# Patient Record
Sex: Female | Born: 2000 | Race: Black or African American | Hispanic: No | Marital: Single | Smoking: Never smoker
Health system: Southern US, Community
[De-identification: ages and names within clinical notes are randomized; demographics above are authoritative.]

## PROBLEM LIST (undated history)

## (undated) DIAGNOSIS — M419 Scoliosis, unspecified: Secondary | ICD-10-CM

---
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
## 2021-01-19 ENCOUNTER — Ambulatory Visit (HOSPITAL_COMMUNITY)
Admission: EM | Admit: 2021-01-19 | Discharge: 2021-01-19 | Disposition: A | Discharge status: Home / Self Care | Payer: Medicaid Other | Attending: Emergency Medicine | Admitting: Emergency Medicine

## 2021-01-19 ENCOUNTER — Other Ambulatory Visit: Payer: Self-pay

## 2021-01-19 ENCOUNTER — Ambulatory Visit (INDEPENDENT_AMBULATORY_CARE_PROVIDER_SITE_OTHER): Payer: Medicaid Other

## 2021-01-19 DIAGNOSIS — M79632 Pain in left forearm: Secondary | ICD-10-CM

## 2021-01-19 DIAGNOSIS — M25532 Pain in left wrist: Secondary | ICD-10-CM

## 2021-01-19 MED ORDER — IBUPROFEN 800 MG PO TABS: 800.0000 mg | ORAL_TABLET | Freq: Three times a day (TID) | ORAL | 0 refills | Status: DC

## 2021-01-19 MED ORDER — CYCLOBENZAPRINE HCL 10 MG PO TABS: 10.0000 mg | ORAL_TABLET | Freq: Every day | ORAL | 0 refills | Status: DC
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
## 2021-01-19 NOTE — Discharge Instructions (Addendum)
Take ibuprofen three times a day for the next 3-5 days then as needed to help with swelling and pain  Can use muscle relaxer at bedtime, this mediation may make you drowsy, if this occurs you may take half a pill to decrease this effect   Can use ice in 15 minutes interval for the next 24 hours then can switch to heat in 15 minutes intervals as needed for comfort  As pain decreases, attempt to move around fingers, hand, wrist and arm as tolerated
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
## 2021-03-29 IMAGING — CR DG THORACIC SPINE 2V
2 series · 2 of 2 positions shown · non-contrast
Comparison: None.

CLINICAL DATA: Chronic back pain

EXAM:
THORACIC SPINE 2 VIEWS

[w thoracic spine ap]
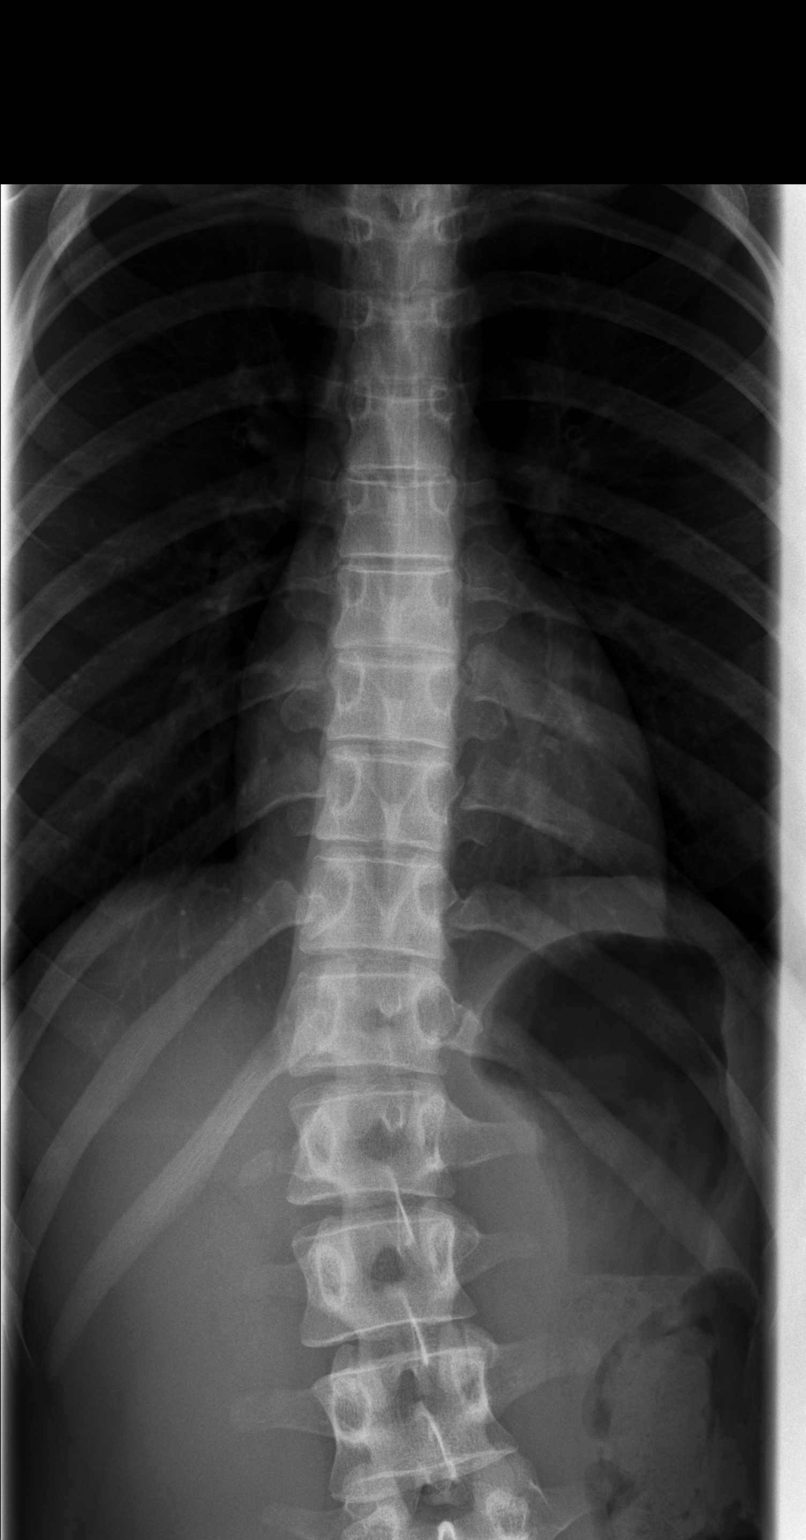

[w thoracic spine lat]
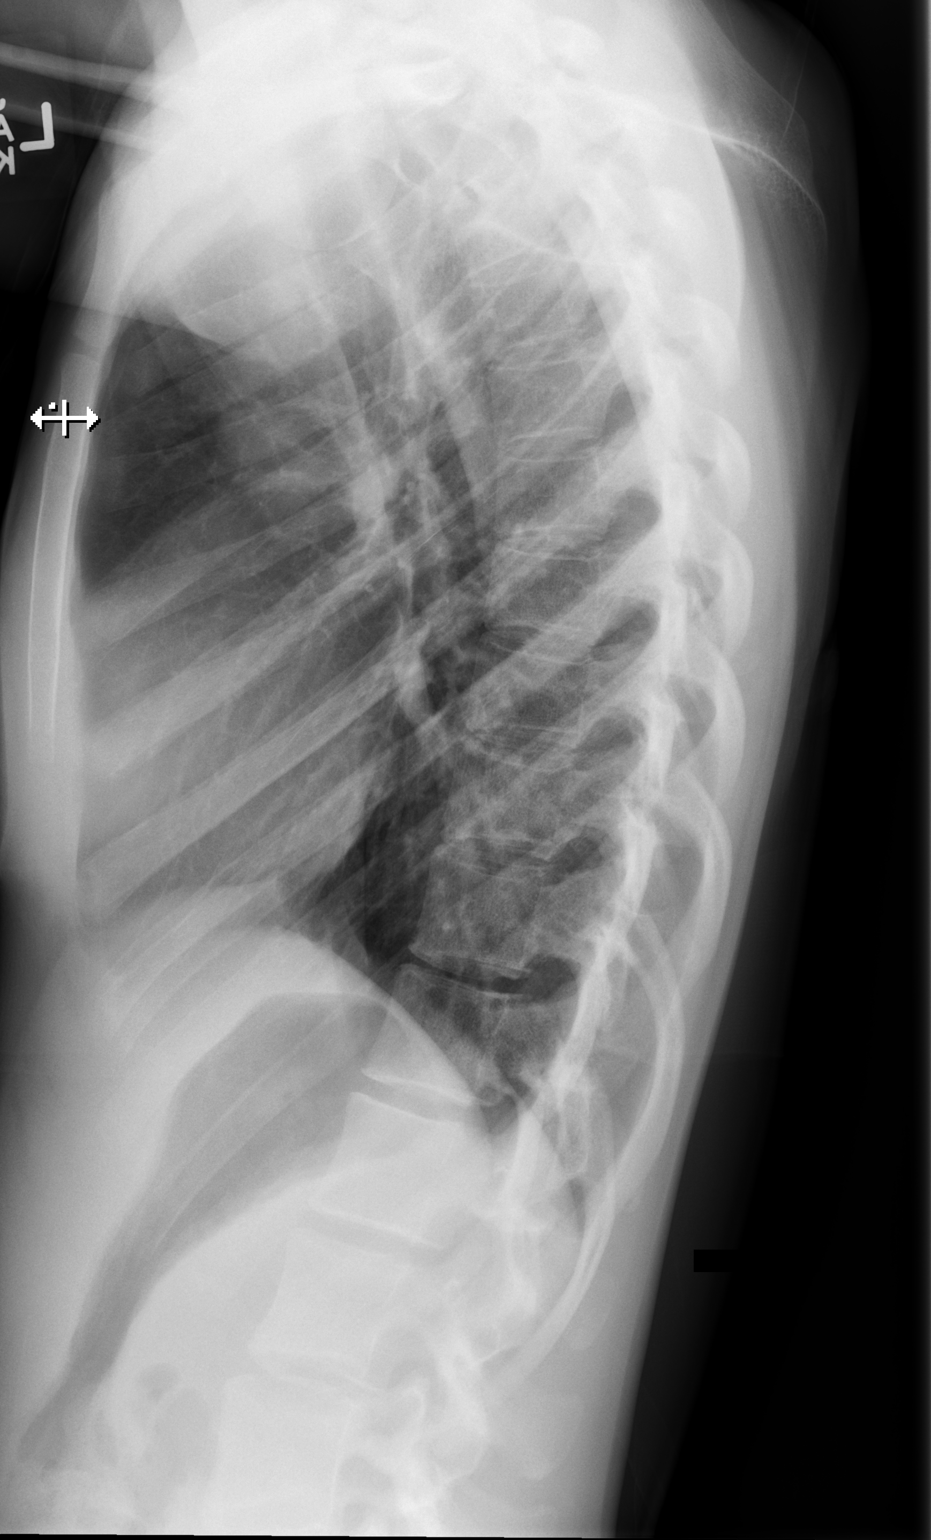

[2 of 2 positions shown; findings below may reference images not displayed]

FINDINGS: Mild dextroscoliosis of the thoracolumbar spine. Sagittal alignment
is normal. Vertebral body heights and disc spaces are normal
IMPRESSION: Mild thoracolumbar scoliosis

## 2021-04-11 ENCOUNTER — Encounter (HOSPITAL_COMMUNITY): Payer: Self-pay

## 2021-04-11 ENCOUNTER — Ambulatory Visit (HOSPITAL_COMMUNITY)
Admission: RE | Admit: 2021-04-11 | Discharge: 2021-04-11 | Disposition: A | Discharge status: Home / Self Care | Payer: Medicaid Other | Source: Ambulatory Visit | Attending: Family Medicine | Admitting: Family Medicine

## 2021-04-11 ENCOUNTER — Other Ambulatory Visit: Payer: Self-pay

## 2021-04-11 VITALS — BP 110/72 | HR 85 | Temp 98.4°F | Resp 17

## 2021-04-11 DIAGNOSIS — R110 Nausea: Secondary | ICD-10-CM

## 2021-04-11 DIAGNOSIS — Z3201 Encounter for pregnancy test, result positive: Secondary | ICD-10-CM | POA: Diagnosis not present

## 2021-04-11 LAB — POC URINE PREG, ED: Preg Test, Ur: POSITIVE — AB

## 2021-04-11 NOTE — ED Triage Notes (Signed)
Pt is present today with a possible pregnancy.

## 2021-04-11 NOTE — ED Provider Notes (Signed)
[facility]    CSN: [id number] Arrival date & time: 04/11/21  1552      History   Chief Complaint Chief Complaint  Patient presents with   Possible Pregnancy    HPI Stephanie Nichols is a 20 y.o. female.   Patient presenting today after getting a positive home pregnancy test several days ago to have pregnancy confirmation.  She states the clinic she is trying to go to requires a documented positive pregnancy test prior to excepting her as a patient.  She does not recall when her LMP was, states she has historically irregular periods.  The reason she took a pregnancy test 2 days ago was because of some unexplained nausea.  She is otherwise asymptomatic, no significant fatigue, abnormal vaginal discharge, spotting or bleeding, pelvic pain or cramping.   Past Medical History:  Diagnosis Date   Scoliosis     There are no problems to display for this patient.   History reviewed. No pertinent surgical history.  OB History   No obstetric history on file.      Home Medications    Prior to Admission medications   Medication Sig Start Date End Date Taking? Authorizing Provider  acetaminophen (TYLENOL) 325 MG tablet Take 2 tablets (650 mg total) by mouth every 6 (six) hours as needed. 03/24/20   Darr, Gerilyn Pilgrim, PA-C  cyclobenzaprine (FLEXERIL) 10 MG tablet Take 1 tablet (10 mg total) by mouth at bedtime. 01/19/21   White, Elita Boone, NP  ibuprofen (ADVIL) 800 MG tablet Take 1 tablet (800 mg total) by mouth 3 (three) times daily. 01/19/21   Valinda Hoar, NP    Family History Family History  Problem Relation Age of Onset   Hypertension Mother     Social History Social History   Tobacco Use   Smoking status: Passive Smoke Exposure - Never Smoker   Smokeless tobacco: Never  Substance Use Topics   Alcohol use: No   Drug use: No     Allergies   Penicillins   Review of Systems Review of Systems Per HPI  Physical Exam Triage Vital Signs ED Triage Vitals  Enc  Vitals Group     BP 04/11/21 1623 110/72     Pulse Rate 04/11/21 1623 85     Resp 04/11/21 1623 17     Temp 04/11/21 1623 98.4 F (36.9 C)     Temp src --      SpO2 04/11/21 1623 97 %     Weight --      Height --      Head Circumference --      Peak Flow --      Pain Score 04/11/21 1622 0     Pain Loc --      Pain Edu? --      Excl. in GC? --    No data found.  Updated Vital Signs BP 110/72   Pulse 85   Temp 98.4 F (36.9 C)   Resp 17   SpO2 97%   Visual Acuity Right Eye Distance:   Left Eye Distance:   Bilateral Distance:    Right Eye Near:   Left Eye Near:    Bilateral Near:     Physical Exam Vitals and nursing note reviewed.  Constitutional:      Appearance: Normal appearance. She is not ill-appearing.  HENT:     Head: Atraumatic.     Mouth/Throat:     Mouth: Mucous membranes are moist.  Pharynx: Oropharynx is clear.  Eyes:     Extraocular Movements: Extraocular movements intact.     Conjunctiva/sclera: Conjunctivae normal.  Cardiovascular:     Rate and Rhythm: Normal rate and regular rhythm.     Heart sounds: Normal heart sounds.  Pulmonary:     Effort: Pulmonary effort is normal.     Breath sounds: Normal breath sounds.  Abdominal:     General: Bowel sounds are normal. There is no distension.     Palpations: Abdomen is soft.     Tenderness: There is no abdominal tenderness. There is no guarding.  Musculoskeletal:        General: Normal range of motion.     Cervical back: Normal range of motion and neck supple.  Skin:    General: Skin is warm and dry.  Neurological:     Mental Status: She is alert and oriented to person, place, and time.  Psychiatric:        Mood and Affect: Mood normal.        Thought Content: Thought content normal.        Judgment: Judgment normal.   UC Treatments / Results  Labs (all labs ordered are listed, but only abnormal results are displayed) Labs Reviewed  POC URINE PREG, ED - Abnormal; Notable for the  following components:      Result Value   Preg Test, Ur POSITIVE (*)    All other components within normal limits    EKG   Radiology No results found.  Procedures Procedures (including critical care time)  Medications Ordered in UC Medications - No data to display  Initial Impression / Assessment and Plan / UC Course  I have reviewed the triage vital signs and the nursing notes.  Pertinent labs & imaging results that were available during my care of the patient were reviewed by me and considered in my medical decision making (see chart for details).     Pregnancy test positive.  Exam and vitals reassuring.  Discussed good prenatal diet, prenatal vitamins and printed out positive result per her request.  Follow-up with OB/GYN for further management.  Final Clinical Impressions(s) / UC Diagnoses   Final diagnoses:  Positive pregnancy test  Nausea   Discharge Instructions   None    ED Prescriptions   None    PDMP not reviewed this encounter.   Particia Nearing, [place] 04/11/21 1656

## 2021-04-20 ENCOUNTER — Ambulatory Visit: Payer: Medicaid Other

## 2022-12-08 ENCOUNTER — Emergency Department (HOSPITAL_BASED_OUTPATIENT_CLINIC_OR_DEPARTMENT_OTHER)
Admission: EM | Admit: 2022-12-08 | Discharge: 2022-12-08 | Disposition: A | Discharge status: Home / Self Care | Payer: Medicaid Other | Attending: Emergency Medicine | Admitting: Emergency Medicine

## 2022-12-08 ENCOUNTER — Encounter (HOSPITAL_BASED_OUTPATIENT_CLINIC_OR_DEPARTMENT_OTHER): Payer: Self-pay

## 2022-12-08 ENCOUNTER — Other Ambulatory Visit: Payer: Self-pay

## 2022-12-08 DIAGNOSIS — R059 Cough, unspecified: Secondary | ICD-10-CM | POA: Diagnosis present

## 2022-12-08 DIAGNOSIS — J069 Acute upper respiratory infection, unspecified: Secondary | ICD-10-CM | POA: Diagnosis not present

## 2022-12-08 DIAGNOSIS — Z1152 Encounter for screening for COVID-19: Secondary | ICD-10-CM | POA: Insufficient documentation

## 2022-12-08 LAB — RESP PANEL BY RT-PCR (RSV, FLU A&B, COVID)  RVPGX2
Influenza A by PCR: NEGATIVE
Influenza B by PCR: NEGATIVE
Resp Syncytial Virus by PCR: NEGATIVE
SARS Coronavirus 2 by RT PCR: NEGATIVE

## 2022-12-08 MED ORDER — IBUPROFEN 400 MG PO TABS
600.0000 mg | ORAL_TABLET | Freq: Once | ORAL | Status: AC
  Administered 2022-12-08: 600 mg via ORAL
  Filled 2022-12-08: qty 1

## 2022-12-08 MED ORDER — ONDANSETRON 4 MG PO TBDP
8.0000 mg | ORAL_TABLET | Freq: Once | ORAL | Status: AC
  Administered 2022-12-08: 8 mg via ORAL
  Filled 2022-12-08: qty 2

## 2022-12-08 MED ORDER — ONDANSETRON HCL 4 MG PO TABS: 4.0000 mg | ORAL_TABLET | Freq: Four times a day (QID) | ORAL | 0 refills | Status: DC

## 2022-12-08 MED ORDER — IBUPROFEN 600 MG PO TABS: 600.0000 mg | ORAL_TABLET | Freq: Four times a day (QID) | ORAL | 0 refills | Status: DC | PRN

## 2022-12-08 NOTE — ED Notes (Signed)
Pt reports fever, chills and nausea x 3 days

## 2022-12-08 NOTE — ED Provider Notes (Signed)
[facility] Provider Note  CSN: [id number] Arrival date & time: 12/08/22 1308  Chief Complaint(s) Chills  HPI Stephanie Nichols is a 22 y.o. female without significant past medical history presenting to the emergency department with bodyaches.  Patient reports body aches for 3 days with associated rhinorrhea, cough, subjective fevers and chills.  She also reports nausea and vomiting.  No diarrhea.  No abdominal pain.  She is taking NyQuil for symptoms.  She also reports mild headache.   Past Medical History Past Medical History:  Diagnosis Date   Scoliosis    There are no problems to display for this patient.  Home Medication(s) Prior to Admission medications   Medication Sig Start Date End Date Taking? Authorizing Provider  ibuprofen (ADVIL) 600 MG tablet Take 1 tablet (600 mg total) by mouth every 6 (six) hours as needed. 12/08/22  Yes Cristie Hem, MD  ondansetron (ZOFRAN) 4 MG tablet Take 1 tablet (4 mg total) by mouth every 6 (six) hours. 12/08/22  Yes Cristie Hem, MD  acetaminophen (TYLENOL) 325 MG tablet Take 2 tablets (650 mg total) by mouth every 6 (six) hours as needed. 03/24/20   Darr, Edison Nasuti, PA-C  cyclobenzaprine (FLEXERIL) 10 MG tablet Take 1 tablet (10 mg total) by mouth at bedtime. 01/19/21   Hans Eden, NP                                                                                                                                    Past Surgical History History reviewed. No pertinent surgical history. Family History Family History  Problem Relation Age of Onset   Hypertension Mother     Social History Social History   Tobacco Use   Smoking status: Never    Passive exposure: Yes   Smokeless tobacco: Never  Substance Use Topics   Alcohol use: Yes    Comment: Few Times/Weekly   Drug use: No   Allergies Penicillins  Review of Systems Review of Systems  All other systems reviewed and are  negative.   Physical Exam Vital Signs  I have reviewed the triage vital signs BP (!) 140/89 (BP Location: Right Arm)   Pulse 87   Temp 98.4 F (36.9 C)   Resp 16   Ht '5\' 5"'$  (1.651 m)   Wt 59 kg   SpO2 99%   BMI 21.63 kg/m  Physical Exam Vitals and nursing note reviewed.  Constitutional:      General: She is not in acute distress.    Appearance: She is well-developed.  HENT:     Head: Normocephalic and atraumatic.     Mouth/Throat:     Mouth: Mucous membranes are moist.  Eyes:     Pupils: Pupils are equal, round, and reactive to light.  Cardiovascular:     Rate and Rhythm: Normal rate and regular rhythm.     Heart sounds: No murmur  heard. Pulmonary:     Effort: Pulmonary effort is normal. No respiratory distress.     Breath sounds: Normal breath sounds.  Abdominal:     General: Abdomen is flat.     Palpations: Abdomen is soft.     Tenderness: There is no abdominal tenderness.  Musculoskeletal:        General: No tenderness.     Right lower leg: No edema.     Left lower leg: No edema.  Skin:    General: Skin is warm and dry.  Neurological:     General: No focal deficit present.     Mental Status: She is alert. Mental status is at baseline.  Psychiatric:        Mood and Affect: Mood normal.        Behavior: Behavior normal.     ED Results and Treatments Labs (all labs ordered are listed, but only abnormal results are displayed) Labs Reviewed  RESP PANEL BY RT-PCR (RSV, FLU A&B, COVID)  RVPGX2                                                                                                                          Radiology No results found.  Pertinent labs & imaging results that were available during my care of the patient were reviewed by me and considered in my medical decision making (see MDM for details).  Medications Ordered in ED Medications  ibuprofen (ADVIL) tablet 600 mg (600 mg Oral Given 12/08/22 1424)  ondansetron (ZOFRAN-ODT) disintegrating  tablet 8 mg (8 mg Oral Given 12/08/22 1425)                                                                                                                                     Procedures Procedures  (including critical care time)  Medical Decision Making / ED Course   MDM:  22 year old female presents to the emergency department for URI type symptoms.  Patient very well-appearing, vital signs reassuring without significant abnormality.  Lungs clear on exam.  Suspect viral URI, COVID/flu/RSV swab sent.  Will give Motrin and Zofran.  Will send prescription for Zofran given nausea.  Doubt pneumonia.  Although patient has nausea and vomiting no abdominal pain or tenderness to suggest intra-abdominal process such as pancreatitis, cholecystitis or appendicitis.  Encourage p.o. fluids.  Clinical Course as of 12/08/22 1531  Wed Dec 08, 2022  1531 Flu/Covid/RSV negative. Will discharge patient to home. All questions answered. Patient comfortable with plan of discharge. Return precautions discussed with patient and specified on the after visit summary. [WS]    Clinical Course User Index [WS] Cristie Hem, MD     Additional history obtained: -Additional history obtained from spouse    Lab Tests: -I ordered, reviewed, and interpreted labs.   The pertinent results include:   Labs Reviewed  RESP PANEL BY RT-PCR (RSV, FLU A&B, COVID)  RVPGX2    Notable for negative flu/covid/RSV  Medicines ordered and prescription drug management: Meds ordered this encounter  Medications   ibuprofen (ADVIL) tablet 600 mg   ondansetron (ZOFRAN-ODT) disintegrating tablet 8 mg   ondansetron (ZOFRAN) 4 MG tablet    Sig: Take 1 tablet (4 mg total) by mouth every 6 (six) hours.    Dispense:  12 tablet    Refill:  0   ibuprofen (ADVIL) 600 MG tablet    Sig: Take 1 tablet (600 mg total) by mouth every 6 (six) hours as needed.    Dispense:  30 tablet    Refill:  0    -I have reviewed the patients  home medicines and have made adjustments as needed   Reevaluation: After the interventions noted above, I reevaluated the patient and found that they have improved  Co morbidities that complicate the patient evaluation  Past Medical History:  Diagnosis Date   Scoliosis       Dispostion: Disposition decision including need for hospitalization was considered, and patient discharged from emergency department.    Final Clinical Impression(s) / ED Diagnoses Final diagnoses:  Viral URI     This chart was dictated using voice recognition software.  Despite best efforts to proofread,  errors can occur which can change the documentation meaning.    Cristie Hem, MD 12/08/22 [phone]

## 2022-12-08 NOTE — ED Triage Notes (Signed)
Patient here POV from Home.  Endorses Productive Cough, Chills, Subjective Fever, nausea for 3 Days.   No Sore Throat or Diarrhea.   NAD Noted during Triage. A&Ox4. Gcs 15. Ambulatory.

## 2022-12-22 ENCOUNTER — Other Ambulatory Visit: Payer: Self-pay

## 2022-12-22 ENCOUNTER — Emergency Department (HOSPITAL_BASED_OUTPATIENT_CLINIC_OR_DEPARTMENT_OTHER)
Admission: EM | Admit: 2022-12-22 | Discharge: 2022-12-22 | Disposition: A | Discharge status: Home / Self Care | Payer: Medicaid Other | Attending: Emergency Medicine | Admitting: Emergency Medicine

## 2022-12-22 DIAGNOSIS — R42 Dizziness and giddiness: Secondary | ICD-10-CM | POA: Insufficient documentation

## 2022-12-22 DIAGNOSIS — M419 Scoliosis, unspecified: Secondary | ICD-10-CM | POA: Diagnosis not present

## 2022-12-22 DIAGNOSIS — M545 Low back pain, unspecified: Secondary | ICD-10-CM

## 2022-12-22 MED ORDER — METHOCARBAMOL 500 MG PO TABS: 500.0000 mg | ORAL_TABLET | Freq: Two times a day (BID) | ORAL | 0 refills | Status: AC

## 2022-12-22 MED ORDER — IBUPROFEN 800 MG PO TABS: 800.0000 mg | ORAL_TABLET | Freq: Four times a day (QID) | ORAL | 0 refills | Status: AC | PRN

## 2022-12-22 MED ORDER — ACETAMINOPHEN 500 MG PO TABS: 1000.0000 mg | ORAL_TABLET | Freq: Four times a day (QID) | ORAL | 0 refills | Status: AC | PRN

## 2022-12-22 MED ORDER — ONDANSETRON HCL 4 MG PO TABS: 4.0000 mg | ORAL_TABLET | Freq: Three times a day (TID) | ORAL | 0 refills | Status: AC | PRN

## 2022-12-22 NOTE — ED Notes (Signed)
Pt verbalized understanding of d/c instructions, meds, and followup care. Denies questions. VSS, no distress noted. Steady gait to exit with all belongings.  ?

## 2022-12-22 NOTE — ED Provider Notes (Signed)
[facility] Provider Note   CSN: [id number] Arrival date & time: 12/22/22  1004     History  Chief Complaint  Patient presents with   Back Pain    Stephanie Nichols is a 22 y.o. female presents to the ED from home complaining of back pain for the past few days.  Patient states that she was diagnosed with scoliosis 2 to 3 years ago and was told that she would not require surgery.  Patient has been given ibuprofen and Robaxin in the past whenever she has flareups of lower back pain.  She reports that the muscle relaxer helps the most.  Patient states her lower back pain is worse whenever she is on her monthly cycle, which she is currently on.  Patient has been taking ibuprofen with minimal relief in her symptoms.  Patient also has concerns of occasional dizzy spells and lightheadedness that will occur at random and have associated nausea.  Patient states the spells will often happen when she is walking around or changing positions such as from sitting to standing.  Denies fever, shortness of breath, chest pain, pelvic pain, headache, seizure, syncope, weakness, vomiting.  She also denies any heavy lifting, falls, or other injury to her spine.      Home Medications Prior to Admission medications   Medication Sig Start Date End Date Taking? Authorizing Provider  acetaminophen (TYLENOL) 500 MG tablet Take 2 tablets (1,000 mg total) by mouth every 6 (six) hours as needed. 12/22/22  Yes Brooklinn Longbottom R, PA  ibuprofen (ADVIL) 800 MG tablet Take 1 tablet (800 mg total) by mouth every 6 (six) hours as needed. 12/22/22  Yes Brendyn Mclaren R, PA  methocarbamol (ROBAXIN) 500 MG tablet Take 1 tablet (500 mg total) by mouth 2 (two) times daily. 12/22/22  Yes Enrrique Mierzwa R, PA  ondansetron (ZOFRAN) 4 MG tablet Take 1 tablet (4 mg total) by mouth every 8 (eight) hours as needed for nausea or vomiting. 12/22/22   Theressa Stamps R, PA      Allergies    Penicillins     Review of Systems   Review of Systems  Constitutional:  Negative for fever.  Respiratory:  Negative for shortness of breath.   Cardiovascular:  Negative for chest pain.  Gastrointestinal:  Positive for nausea. Negative for vomiting.  Genitourinary:  Negative for pelvic pain.  Musculoskeletal:  Positive for back pain. Negative for neck pain.  Neurological:  Positive for dizziness and light-headedness. Negative for seizures, syncope, weakness and headaches.    Physical Exam Updated Vital Signs BP 139/85 (BP Location: Right Arm)   Pulse 76   Temp 98.5 F (36.9 C)   Resp 12   Ht '5\' 5"'$  (1.651 m)   Wt 48.5 kg   LMP 12/20/2022 (Approximate)   SpO2 100%   BMI 17.81 kg/m  Physical Exam Vitals and nursing note reviewed.  Constitutional:      General: She is not in acute distress.    Appearance: Normal appearance. She is not ill-appearing or diaphoretic.  HENT:     Head: Normocephalic.     Right Ear: Tympanic membrane and ear canal normal.     Left Ear: Tympanic membrane and ear canal normal.  Cardiovascular:     Rate and Rhythm: Normal rate and regular rhythm.  Pulmonary:     Effort: Pulmonary effort is normal.  Musculoskeletal:     Cervical back: Full passive range of motion without pain.  Lumbar back: Spasms and tenderness present. No bony tenderness. Normal range of motion. Scoliosis present.       Back:     Comments: Patient has tenderness to palpation over the left paraspinal muscles with appreciable spasm.  She has no bony tenderness along the entirety of the spine.  She does have mild scoliosis.  Neurological:     Mental Status: She is alert. Mental status is at baseline.  Psychiatric:        Mood and Affect: Mood normal.        Behavior: Behavior normal.     ED Results / Procedures / Treatments   Labs (all labs ordered are listed, but only abnormal results are displayed) Labs Reviewed - No data to display  EKG None  Radiology No results  found.  Procedures Procedures    Medications Ordered in ED Medications - No data to display  ED Course/ Medical Decision Making/ A&P                             Medical Decision Making  This patient presents to the ED with chief complaint(s) of lower back pain, occasional dizzy spells with pertinent past medical history of scoliosis, daily marijuana use.  The complaint involves an extensive differential diagnosis and also carries with it a high risk of complications and morbidity.    The differential diagnosis includes musculoskeletal strain, muscle spasm, degenerative changes related to scoliosis, vertigo, orthostatic hypotension (possibly related to chronic marijuana use), endometriosis  The initial plan is to assess patient  Additional history obtained: Records reviewed  previous urgent care visits where patient was seen for acute lower back pain and given Robaxin and ibuprofen for symptoms  Initial Assessment:   Exam significant for overall well-appearing patient who is not in acute distress.  She has 5/5 strength in bilateral upper and lower extremities with normal gait.  Sensation is also intact.  She has tenderness to palpation of the left lower paraspinal muscles with appreciable spasm.  No bony tenderness, mild scoliosis is appreciated along the spine.  She is normocephalic with normal EACs and TMs.  No middle ear effusions.  Independent visualization and interpretation of imaging: I independently visualized the following imaging with scope of interpretation limited to determining acute life threatening conditions related to emergency care: Not indicated.  I do not believe that patient requires imaging at this time for her atraumatic back pain.  Disposition:   Discussed treatment options with patient including prescribing ibuprofen and muscle relaxers for her lower back pain.  Will also give Zofran and meclizine for possible vertigo type episodes.  Advised patient to follow-up  with her primary care provider as she has ongoing chronic issues.  Will also provide patient orthopedic follow-up information for her to schedule an appointment regarding her acute on chronic back issues.  The patient has been appropriately medically screened and/or stabilized in the ED. I have low suspicion for any other emergent medical condition which would require further screening, evaluation or treatment in the ED or require inpatient management. At time of discharge the patient is hemodynamically stable and in no acute distress. I have discussed work-up results and diagnosis with patient and answered all questions. Patient is agreeable with discharge plan. We discussed strict return precautions for returning to the emergency department and they verbalized understanding.          Final Clinical Impression(s) / ED Diagnoses Final diagnoses:  Acute left-sided low  back pain without sciatica  Dizziness    Rx / DC Orders ED Discharge Orders          Ordered    ondansetron (ZOFRAN) 4 MG tablet  Every 8 hours PRN        12/22/22 1225    ibuprofen (ADVIL) 800 MG tablet  Every 6 hours PRN        12/22/22 1225    acetaminophen (TYLENOL) 500 MG tablet  Every 6 hours PRN        12/22/22 1225    methocarbamol (ROBAXIN) 500 MG tablet  2 times daily        12/22/22 1225              Theressa Stamps [place], [place] 12/22/22 1226    Davonna Belling, MD 12/23/22 [phone]

## 2022-12-22 NOTE — Discharge Instructions (Signed)
Thank you for allowing me to be part of your care today.  I have sent prescriptions over to your pharmacy including a muscle relaxer, ibuprofen, nausea medication, and meclizine for dizziness.  I recommend scheduling appointment with your primary care provider for follow-up regarding your ongoing issues.  I have also provided information for orthopedic follow-up regarding your ongoing back pain.  I recommend scheduling an appointment as you may require further workup including advanced imaging.

## 2022-12-22 NOTE — ED Triage Notes (Signed)
Pt via pov from home with back pain x 1 week. Pt reports she was diagnosed with "barely slanted scoliosis" 2-3 years ago and was told she did not require surgery. Pt also reports occasional dizzy spells, the last one over 1 year ago. Pt alert & oriented, nad noted.
